# Patient Record
Sex: Female | Born: 1980 | Hispanic: Yes | Marital: Married | Smoking: Never smoker
Health system: Southern US, Community
[De-identification: ages and names within clinical notes are randomized; demographics above are authoritative.]

---
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
## 2004-12-30 ENCOUNTER — Ambulatory Visit: Payer: Self-pay | Admitting: Internal Medicine

## 2005-01-13 ENCOUNTER — Ambulatory Visit: Payer: Self-pay | Admitting: Internal Medicine

## 2010-07-17 NOTE — Discharge Summary (Signed)
[facility]  Patient:    Alexandra Fuentes, Alexandra Fuentes                       MRN: [id number] Adm. Date:  [id number] Disc. Date: [id number] Attending:  Antionette Char Dictator:   Maryelizabeth Rowan, M.D.                           Discharge Summary  DATE OF BIRTH:                1980/12/22  PRIMARY DIAGNOSIS:            Complete spontaneous abortion.  SECONDARY DIAGNOSIS:          Orthostatic hypotension.  HISTORY OF PRESENT ILLNESS:   This 30 year old G1, P0, presented with LMP of February 17, 2000, and estimated gestational age of 15-5/7 weeks.  She presented complaining of heavy vaginal bleeding of onset the morning of admission with cramping and passing of some blood clots.  She has not had a prenatal visit as of yet and does have an appointment at [facility] this week.  Evaluation revealed cervical exam significant for dilation of about 4 cm with bulging membranes, large amount of blood and clots in the vaginal vault.  HOSPITAL COURSE: #1 - The patient was admitted with inevitable spontaneous abortion.  Treated with expectant management.  The patient had an ultrasound with estimated gestational age at 7 weeks. After this finding, the patient passed fetus without difficulty. Dr. Tamela Oddi delivered the fetus and reports minimal bleeding.  The patient was given doxycycline 100 mg IV status post delivery.  The patients blood type was A positive and, therefore, she did not receive RhoGAM.  #2 - ORTHOSTATIC HYPOTENSION:  The patient had significant orthostatics on admission which included a lying blood pressure of 138/58, pulse 48; sitting blood pressure 106/60, pulse 71; standing 102/58, pulse 92.  The patient received three units of IV fluids, and orthostatic hypotension resolved.  The patient still had significant pulse changes going from lying 55, sitting 73, and standing 93.  The patient has been entirely asymptomatic and denied dizziness,  changes in vision, or difficulty ambulating.  Hemoglobin on admission was 14.1 with hematocrit of 40.1.  LABORATORY DATA:              Hemoglobin and hematocrit 14.1 and 40.1.  Ultrasound describing gestational sac in the cervix containing nonliving embryo of approximately 7 weeks 5 days.  Also describing thickened endometrium approximately 2.7 cm.  PROCEDURES:                   Vaginal delivery, as described above, per Dr. Tamela Oddi.  CONSULTATIONS:                None.  DISPOSITION:                  The patient was discharged home.  CONDITION ON DISCHARGE:       Stable.  FOLLOW-UP:                    Follow-up appointment with Dr. Tamela Oddi in one week.  DISCHARGE INSTRUCTIONS:       The patient was instructed to maintain a regular diet with good fluid intake and to call Dr. Tamela Oddi if she has any increase in vaginal bleeding at all. DD:  06/06/00 TD:  06/07/00 Job: [id number]

## 2014-08-19 ENCOUNTER — Ambulatory Visit (INDEPENDENT_AMBULATORY_CARE_PROVIDER_SITE_OTHER): Payer: Self-pay | Admitting: Internal Medicine

## 2014-08-19 VITALS — BP 128/78 | HR 70 | Temp 97.7°F | Resp 17 | Ht 65.0 in | Wt 164.4 lb

## 2014-08-19 DIAGNOSIS — K6289 Other specified diseases of anus and rectum: Secondary | ICD-10-CM

## 2014-08-19 DIAGNOSIS — R37 Sexual dysfunction, unspecified: Secondary | ICD-10-CM

## 2014-08-19 LAB — POCT CBC
Granulocyte percent: 65.2 %G (ref 37–80)
HCT, POC: 42.2 % (ref 37.7–47.9)
Hemoglobin: 14.2 g/dL (ref 12.2–16.2)
Lymph, poc: 2.2 (ref 0.6–3.4)
MCH, POC: 31.1 pg (ref 27–31.2)
MCHC: 33.5 g/dL (ref 31.8–35.4)
MCV: 92.8 fL (ref 80–97)
MID (cbc): 0.7 (ref 0–0.9)
MPV: 8.7 fL (ref 0–99.8)
POC Granulocyte: 5.3 (ref 2–6.9)
POC LYMPH PERCENT: 26.8 %L (ref 10–50)
POC MID %: 8 %M (ref 0–12)
Platelet Count, POC: 227 10*3/uL (ref 142–424)
RBC: 4.55 M/uL (ref 4.04–5.48)
RDW, POC: 13.9 %
WBC: 8.2 10*3/uL (ref 4.6–10.2)

## 2014-08-19 LAB — TSH: TSH: 1.999 u[IU]/mL (ref 0.350–4.500)

## 2014-08-19 MED ORDER — HYDROCORTISONE 1 % RE CREA: 1.0000 "application " | TOPICAL_CREAM | Freq: Two times a day (BID) | RECTAL | Status: AC

## 2014-08-19 NOTE — Progress Notes (Signed)
MRN: [id number] DOB: [birth year]  Subjective:   Alexandra Fuentes is a 34 y.o. female presenting for chief complaint of Hemorrhoids  Hemorrhoids - reports several month history of intermittent itchy, stinging sensation in perianal/rectal area, state that she has palpated a small protrusion. Has not tried medications for relief. Denies fevers, bleeding, bloody stool or blood on the toilet paper, constipation, straining. Patient admits healthy diet and regular exercise. Denies history of hemorrhoids. Of note, patient works out doing heavy lifting including squats and deadlifts, uses heavy weight. smoking cigarettes, has one alcohol drink per week. Denies any other aggravating or relieving factors, no other questions or concerns.  Decreased libido - reports about 1.5 month history of decreased sex drive. Patient cannot identify any inciting factors. States that she has been married for 15 years, is in a monogamous relationship, reports that she has a good healthy relationship with her husband. She does not experience any issues with spousal abuse, recalls that she would enjoy sex, would have orgasms. She denies any mood symptoms including depression or stress. She has one 1 year old daughter and reports that she has a healthy relationship with her as well. She denies any difficulties with her self-image or weight. Denies weight gain, thinning of hair, dry skin. She does not take any medications or have any chronic medical conditions, no past surgical history. Has had copper IUD going on 10 years. Denies history of irregular or painful menstrual cycles, currently has one cycle each month and is regular.  Alexandra Fuentes currently has no medications in their medication list. She has no allergies on file.  Alexandra Fuentes  has no past medical history on file. Also  has no past surgical history on file.  ROS As in subjective.  Objective:   Vitals: BP 128/78 mmHg  Pulse 70  Temp(Src) 97.7 F (36.5 C) (Oral)  Resp  17  Ht  (1.651 m)  Wt 164 lb 6.4 oz (74.571 kg)  BMI 27.36 kg/m2  SpO2 100%  LMP 07/19/2014  Physical Exam  Constitutional: She appears well-developed and well-nourished.  Cardiovascular: Normal rate.   Pulmonary/Chest: Effort normal.  Genitourinary: Rectal exam shows no external hemorrhoid (hemorrhoidal skin tag present ~11 o'clock position), no internal hemorrhoid, no fissure, no mass, no tenderness and anal tone normal.  Skin: Skin is warm and dry. No rash noted. No erythema. No pallor.  Psychiatric: She has a normal mood and affect.   Results for orders placed or performed in visit on 08/19/14 (from the past 24 hour(s))  POCT CBC     Status: None   Collection Time: 08/19/14 12:52 PM  Result Value Ref Range   WBC 8.2 4.6 - 10.2 K/uL   Lymph, poc 2.2 0.6 - 3.4   POC LYMPH PERCENT 26.8 10 - 50 %L   MID (cbc) 0.7 0 - 0.9   POC MID % 8.0 0 - 12 %M   POC Granulocyte 5.3 2 - 6.9   Granulocyte percent 65.2 37 - 80 %G   RBC 4.55 4.04 - 5.48 M/uL   Hemoglobin 14.2 12.2 - 16.2 g/dL   HCT, POC 40.9 81.1 - 47.9 %   MCV 92.8 80 - 97 fL   MCH, POC 31.1 27 - 31.2 pg   MCHC 33.5 31.8 - 35.4 g/dL   RDW, POC 91.4 %   Platelet Count, POC 227 142 - 424 K/uL   MPV 8.7 0 - 99.8 fL   Assessment and Plan :   1. Rectal pain - Suspect this  is do to mild external hemorrhoids which are not apparent at the moment. However, hemorrhoidal skin tag suggests that this may be an intermittent and recurring issue. No evidence of fissures. Recommended using hydrocortisone rectal cream in hemorrhoids return. She may also use Epsom salts for baths. Advised that she return to clinic if this is the case for reexamination. For now I recommended that she take a break from heavy lifting and do more cardio type of exercise. Continue healthy diet.  2. Sexual dysfunction - Labs pending, likely undergoing psychogenic source for decreased libido since she is an otherwise healthy woman. Dr. Merla Riches recommended  reading Pleasure Bond with her husband. Also, made other recommendations regarding her husband's approach with her as well. Follow up in 4-8 weeks.  Wallis Bamberg, PA-C [facility] [phone] 08/19/2014 12:09 PM I have participated in the care of this patient with the Advanced Practice Provider and agree with Diagnosis and Plan as documented. Robert P. Merla Riches, M.D.

## 2014-08-19 NOTE — Patient Instructions (Addendum)
Hydrocortisone rectal cream Qu es este medicamento? La HIDROCORTISONA es un corticosteroide. Se utiliza para disminuir la hinchazn, picazn y dolor provocadas por irritaciones de la piel o hemorroides leves. Este medicamento puede ser utilizado para otros usos; si tiene alguna pregunta consulte con su proveedor de atencin mdica o con su farmacutico. MARCAS COMERCIALES DISPONIBLES: Anusol HC, Procto-Kit, Procto-Pak, Proctocort, Proctocream-HC, Proctosol-HC, Proctozone-HC Tree surgeon a mi profesional de la salud antes de tomar este medicamento? Necesita saber si usted presenta alguno de los siguientes problemas o situaciones: -una reaccin alrgica o inusual a la hidrocortisona, a los corticosteroides, a otros medicamentos, alimentos, colorantes o conservantes -si est embarazada o buscando quedar embarazada -si est amamantando a un beb Cmo debo utilizar este medicamento? Este medicamento es slo para uso rectal. No lo ingiera por va oral. No lo aplique en los ojos. Siga las instrucciones de la etiqueta del [place]. Lvese las manos antes y despus de usarlo. Aplique una pequea capa sobre las zonas afectadas. No lo utilice sobre la piel sana o sobre grandes zonas de la piel. No la cubra con un vendaje o apsito a menos que se lo indique su mdico o su profesional de Technical sales engineer. Si debe cubrir la zona, siga atentamente las instrucciones. Si cubre la zona puede aumentar la cantidad que penetra en la piel, lo cual aumenta el riesgo de tener efectos secundarios. No utilice su medicamento con una frecuencia mayor a la indicada. Es importante que no utilice ms medicamento que lo indicado. Hable con su pediatra para informarse acerca del uso de este medicamento en nios. Puede requerir atencin especial. Si est aplicando este medicamento sobre la zona donde le [employer] paales al nio, no la cubra con paales ajustados o con calzones de plstico. Sobredosis: Pngase en contacto  inmediatamente con un centro toxicolgico o una sala de urgencia si usted cree que haya tomado demasiado medicamento. ATENCIN: [employer] es solo para usted. No comparta este medicamento con nadie. Qu sucede si me olvido de una dosis? Si olvida una dosis, aplquela lo antes posible. Si es casi la hora de la prxima dosis, aplique slo esa dosis. No use dosis dobles o adicionales. Qu puede interactuar con este medicamento? No se esperan interacciones. No utilice otros productos de la piel sobre el rea afectado sin Teacher, adult education a su mdico o a su profesional de Technical sales engineer. Puede ser que esta lista no menciona todas las posibles interacciones. Informe a su profesional de KB Home	[place] de [employer] productos a base de hierbas, medicamentos de [place] o suplementos nutritivos que est tomando. Si usted fuma, consume bebidas alcohlicas o si utiliza drogas ilegales, indqueselo tambin a su profesional de KB Home	[place]. Algunas sustancias pueden interactuar con su medicamento. A qu debo estar atento al usar Coca-Cola? Si sus sntomas no mejoran despus de usar [place], informe a su mdico o a su profesional de KB Home	[place]. Si desarrolla cualquier tipo de infeccin mientras [place] este medicamento, podr Aeronautical engineer de usar este medicamento hasta que su infeccin se mejora. Pregunte a su mdico o su profesional de la salud por asesoramiento. Qu efectos secundarios puedo tener al [employer] este medicamento? Efectos secundarios que debe informar a su mdico o a Barrister's clerk de la salud tan pronto como sea posible: -ardor o picazn de la piel -manchas rojas oscuras en la piel -infeccin -cuando el problema de la piel no se cura -formacin de ampollas llenas de pus, rojas y dolorosas en los folculos pilosos -adelgazamiento de  la piel Efectos secundarios que, por lo general, no requieren atencin mdica (debe informarlos a su mdico o a su profesional de la salud si persisten o si son  molestos): -piel seca, irritacin -crecimiento inusual de vello en el rostro o cuerpo Puede ser que esta lista no menciona todos los posibles efectos secundarios. Comunquese a su mdico por asesoramiento mdico Humana Inc. Usted puede informar los efectos secundarios a la FDA por telfono al 1-800-FDA-1088. Dnde debo guardar mi medicina? Mantngala fuera del alcance de los nios. Gurdela a [employer] Group, entre 20 y 87 grados C (67 y 41 grados F). Protjala del calor y no congelar. Deseche todo el medicamento que no haya utilizado, despus de la fecha de vencimiento. ATENCIN: Este folleto es un resumen. Puede ser que no cubra toda la posible informacin. Si usted tiene preguntas acerca de esta medicina, consulte con su mdico, su farmacutico o su profesional de Technical sales engineer.  2015, Elsevier/Gold Standard. (2006-04-13 12:18:00)   Hemorroides  (Hemorrhoids) Las hemorroides son venas inflamadas alrededor del recto o del ano. Hay dos tipos de hemorroides:   Hemorroides internas. Se forman en las venas del interior del recto. Pueden abultarse hacia el exterior e irritarse y Secretary/administrator.  Hemorroides externas. Se producen en las venas externas al ano y pueden sentirse como un bulto o zona hinchada dura y dolorosa cerca del ano. CAUSAS   Embarazo.   Obesidad.   Constipacin o diarrea.   Dificultad para mover el intestino.   Permanecer sentado durante largos perodos en el inodoro.  Levantar objetos pesados u otras actividades que impliquen esfuerzo.  Sexo anal. SNTOMAS   Dolor.   Picazn o irritacin anal.   Sangrado rectal.   Prdida fecal.   Inflamacin anal.   Uno o ms bultos en la zona anal.  DIAGNSTICO  El mdico puede diagnosticar las hemorroides mediante un examen visual. Otros estudios o anlisis que se pueden realizar son:   Examen de la zona rectal con Ardelia Mems mano enguantada (examen digital rectal).   Examen del canal anal  utilizando un pequeo tubo (endoscopio).   Anlisis de sangre si ha perdido [place] cantidad significativa de [place].  Un estudio para observar el interior del colon (sigmoideoscopa o colonoscopa). TRATAMIENTO  La mayora de las hemorroides pueden tratarse en casa. Sin embargo, si los sntomas no mejoran o tiene Nurse, children's, el mdico puede Optometrist un procedimiento para disminuir las hemorroides o extirparlas completamente. Los tratamientos posibles son:   Colocacin de una banda de goma en la base de la hemorroide para cortar la circulacin (ligadura con Forensic psychologist).   Inyeccin de una sustancia qumica para Neurosurgeon las hemorroides (escleroterapia).   Utilizacin de un instrumento para quemar las hemorroides (terapia con luz infrarroja).   Extirpacin quirrgica de la hemorroides (hemorroidectoma).   Colocacin de grapas en la hemorroides para bloquear el flujo de sangre a los tejidos (engrapado de hemorroides).  INSTRUCCIONES PARA EL CUIDADO EN EL HOGAR   Consuma alimentos con fibra, como cereales integrales, legumbres, frutos secos, frutas y verduras. Pregntele a su mdico acerca de tomar productos con fibra aadida en ellos (suplementos defibra).  Aumente la ingestin de lquidos. Beba gran cantidad de lquido para mantener la orina de tono claro o color amarillo plido.   Haga ejercicios regularmente.   Vaya al bao cuando sienta la necesidad de mover el intestino. No espere.   Evite hacer fuerza al mover el intestino.   Mantenga la zona anal limpia y seca. Use papel higinico hmedo  o toallitas humedecidas despus de mover el intestino.   Puede usar o Midwife segn las indicaciones algunas cremas especiales y supositorios.   Tome slo medicamentos de venta libre o recetados, segn las indicaciones del mdico.   Tome baos de asiento tibios durante 15-20 minutos, 3-4 veces por da para Glass blower/designer y las [place].   Coloque una bolsa de hielo  sobre las hemorroides si le duelen o se hinchan. Usar compresas de [employer]ant baos de asiento puede ser [place].   Ponga el hielo en una bolsa plstica.   Colquese una toalla entre la piel y la bolsa de hielo.   Deje el hielo durante 15 - 20 minutos y aplquelo 3 - 4 veces por Training and development officer.   No utilice una almohada en forma de aro ni se siente en el inodoro durante perodos prolongados. Esto aumenta la afluencia de sangre y Conservation officer, historic buildings.  SOLICITE ATENCIN MDICA SI:   Aumenta el dolor y la hinchazn y no puede controlarlo con la medicacin o con Lexicographer.  Tiene un sangrado que no IT consultant.  No puede mover el intestino.  Siente dolor o tiene inflamacin fuera de la zona de las hemorroides. ASEGRESE DE QUE:   Comprende estas instrucciones.  Controlar su enfermedad.  Recibir ayuda de inmediato si no mejora o si empeora. Document Released: 02/15/2005 Document Revised: 10/18/2012 [facility] Patient Information 2015 [place]. This information is not intended to replace advice given to you by your health care provider. Make sure you discuss any questions you have with your health care provider.   SEXUAL DYSFUNCTION - We recommend reading the book The Pleasure Bond by Wynetta Emery and Masters. - For now, it would be important for you husband to pay attention to any cues that may show up as a source for your decreased sex drive. - It would be important for your husband to also pay attention to certain acts that he can perform to help you with your desire/mood. - Follow up in 4-8 weeks.

## 2017-06-26 ENCOUNTER — Other Ambulatory Visit: Payer: Self-pay

## 2017-06-26 ENCOUNTER — Emergency Department (HOSPITAL_COMMUNITY): Payer: Self-pay

## 2017-06-26 ENCOUNTER — Encounter (HOSPITAL_COMMUNITY): Payer: Self-pay | Admitting: Emergency Medicine

## 2017-06-26 ENCOUNTER — Emergency Department (HOSPITAL_COMMUNITY)
Admission: EM | Admit: 2017-06-26 | Discharge: 2017-06-26 | Disposition: A | Discharge status: Home / Self Care | Payer: Self-pay | Attending: Emergency Medicine | Admitting: Emergency Medicine

## 2017-06-26 DIAGNOSIS — S52612A Displaced fracture of left ulna styloid process, initial encounter for closed fracture: Secondary | ICD-10-CM

## 2017-06-26 DIAGNOSIS — S52615A Nondisplaced fracture of left ulna styloid process, initial encounter for closed fracture: Secondary | ICD-10-CM | POA: Insufficient documentation

## 2017-06-26 DIAGNOSIS — S52501A Unspecified fracture of the lower end of right radius, initial encounter for closed fracture: Secondary | ICD-10-CM

## 2017-06-26 DIAGNOSIS — Y9366 Activity, soccer: Secondary | ICD-10-CM | POA: Insufficient documentation

## 2017-06-26 DIAGNOSIS — Y999 Unspecified external cause status: Secondary | ICD-10-CM | POA: Insufficient documentation

## 2017-06-26 DIAGNOSIS — Y929 Unspecified place or not applicable: Secondary | ICD-10-CM | POA: Insufficient documentation

## 2017-06-26 DIAGNOSIS — W51XXXA Accidental striking against or bumped into by another person, initial encounter: Secondary | ICD-10-CM | POA: Insufficient documentation

## 2017-06-26 MED ORDER — HYDROCODONE-ACETAMINOPHEN 5-325 MG PO TABS: 1.0000 | ORAL_TABLET | Freq: Four times a day (QID) | ORAL | 0 refills | Status: AC | PRN

## 2017-06-26 MED ORDER — HYDROMORPHONE HCL 2 MG/ML IJ SOLN
0.5000 mg | Freq: Once | INTRAMUSCULAR | Status: AC
  Administered 2017-06-26: 0.5 mg via INTRAMUSCULAR
  Filled 2017-06-26: qty 1

## 2017-06-26 NOTE — Progress Notes (Signed)
Orthopedic Tech Progress Note Patient Details:  Alexandra Fuentes [birth year] [id number]  Ortho Devices Type of Ortho Device: Ace wrap, Arm sling, Sugartong splint Ortho Device/Splint Location: lue Ortho Device/Splint Interventions: Application   Post Interventions Patient Tolerated: Well Instructions Provided: Care of device   [name] 06/26/2017, 2:12 PM

## 2017-06-26 NOTE — Discharge Instructions (Signed)
You have a left broken wrist.  Please call and follow up with orthopedist on Tuesday for further care.  Take pain medication as needed, but be aware that it can cause drowsiness.

## 2017-06-26 NOTE — ED Provider Notes (Signed)
[facility] EMERGENCY DEPARTMENT Provider Note   CSN: [id number] Arrival date & time: 06/26/17  1125     History   Chief Complaint Chief Complaint  Patient presents with  . Wrist Injury    HPI Alexandra Fuentes is a 37 y.o. female.  HPI   37 year old female presenting for evaluation of her left wrist injury.  Approximately an hour and a half ago, patient was playing soccer, as she was going for the ball, she collided with another player, fell backward, reaching her left hand to break her fall.  She fell and immediately sharp pain to her left wrist and report a pop.  Pain initially very intense, she nearly passed out, pain is since improved with rest.  Currently rates pain a 6 out of 10.  Denies any pain in her left hand or left elbow.  Denies any numbness.  She is right-hand dominant.  She denies any prior injury to the left hand.  She did take Tylenol prior to arrival.  No past medical history on file.  There are no active problems to display for this patient.   The histories are not reviewed yet. Please review them in the "History" navigator section and refresh this SmartLink.   OB History   None      Home Medications    Prior to Admission medications   Not on File    Family History Family History  Problem Relation Age of Onset  . Cancer Maternal Grandmother     Social History Social History   Tobacco Use  . Smoking status: Never Smoker  Substance Use Topics  . Alcohol use: Not on file  . Drug use: Not on file     Allergies   Patient has no known allergies.   Review of Systems Review of Systems  Constitutional: Negative for fever.  Musculoskeletal: Positive for joint swelling.  Skin: Negative for wound.  Neurological: Negative for numbness.     Physical Exam Updated Vital Signs LMP 06/12/2017   Physical Exam  Constitutional: She appears well-developed and well-nourished. No distress.  HENT:  Head: Atraumatic.  Eyes:  Conjunctivae are normal.  Neck: Neck supple.  Cardiovascular: Intact distal pulses.  Musculoskeletal: She exhibits deformity (Left wrist: Obvious closed deformity noted to left wrist with dorsal angulation, tenderness to palpation with crepitus.  No skin tenting.  Radial pulse 2+.  Decreased range of motion secondary to pain.  Sensation intact distally.).  Left elbow nontender.  Left forearm is soft. L hand with intact sensation, brisk cap refill and no significant pain.   Neurological: She is alert.  Skin: No rash noted.  Psychiatric: She has a normal mood and affect.  Nursing note and vitals reviewed.    ED Treatments / Results  Labs (all labs ordered are listed, but only abnormal results are displayed) Labs Reviewed - No data to display  EKG None  Radiology Dg Wrist Complete Left  Result Date: 06/26/2017 CLINICAL DATA:  Post reduction. EXAM: LEFT WRIST - COMPLETE 3+ VIEW COMPARISON:  Earlier today. FINDINGS: Overlying cast obscures evaluation of bony detail. Interval reduction of patient's distal radial and ulnar fractures with near anatomic alignment on the AP view and very minimal residual dorsal angulation of the distal radial fragment on the lateral view. Remainder the exam is unchanged. IMPRESSION: Near anatomic alignment on the AP view post reduction of patient's distal radial and ulnar fractures with minimal residual dorsal angulation on the lateral view involving the distal radial fragment. Electronically Signed  By: Elberta Fortis M.D.   On: 06/26/2017 14:48   Dg Wrist Complete Left  Result Date: 06/26/2017 CLINICAL DATA:  Pain after trauma EXAM: LEFT WRIST - COMPLETE 3+ VIEW COMPARISON:  None. FINDINGS: There is a fracture through the ulnar styloid. There is a comminuted mildly displaced fracture through the distal radius with angulation based on the lateral view. No other fractures. IMPRESSION: Displaced angulated comminuted fracture through the distal radius. Mildly  displaced ulnar styloid fracture. Electronically Signed   By: Gerome Sam III M.D   On: 06/26/2017 13:01    Procedures .Splint Application Date/Time: 06/26/2017 2:58 PM Performed by: [name] Authorized by: Fayrene Helper, PA-C   Consent:    Consent obtained:  Verbal   Consent given by:  Patient   Risks discussed:  Numbness and swelling   Alternatives discussed:  Observation and no treatment Pre-procedure details:    Sensation:  Normal Procedure details:    Laterality:  Left   Location:  Wrist   Wrist:  L wrist   Strapping: no     Cast type:  Long arm (sugar tong)   Splint type:  Sugar tong   Supplies:  Ortho-Glass Post-procedure details:    Pain:  Improved   Sensation:  Normal   Patient tolerance of procedure:  Tolerated well, no immediate complications   (including critical care time)  Medications Ordered in ED Medications  HYDROmorphone (DILAUDID) injection 0.5 mg (0.5 mg Intramuscular Given 06/26/17 1258)     Initial Impression / Assessment and Plan / ED Course  I have reviewed the triage vital signs and the nursing notes.  Pertinent labs & imaging results that were available during my care of the patient were reviewed by me and considered in my medical decision making (see chart for details).     LMP 06/12/2017    Final Clinical Impressions(s) / ED Diagnoses   Final diagnoses:  Closed fracture of distal end of right radius, unspecified fracture morphology, initial encounter  Closed nondisplaced fracture of styloid process of left ulna, initial encounter    ED Discharge Orders    None     12:15 PM Patient with mechanical injury to her left wrist while playing soccer.  This is nondominant wrist.  There is an obvious closed deformity.  X-ray ordered.  She is neurovascularly intact.  I offered pain medication but patient declined at this time.  2:54 PM X-ray of left wrist demonstrate a displaced angulated comminuted fracture through the distal  radius with mildly displaced ulnar styloid fracture.  This is a closed injury, patient is neurovascularly intact.  Appreciate consultation with hand specialist, Dr. Jena Gauss, who recommend reduction and placement of sugar tong splint.  Patient will need to call or follow-up promptly in the office as she may need surgery.  I was able to successfully reduce her wrist with out using procedural sedation.  Patient tolerates well.  An Ortho-Glass splint was placed with a sling provided.  Pain medication prescribed.  She will follow-up promptly with hand specialist.  Patient is neurovascular intact post reduction.     Fayrene Helper, PA-C 06/26/17 1500    Mancel Bale, MD 06/27/17 [phone]

## 2017-06-26 NOTE — Progress Notes (Signed)
Orthopedic Tech Progress Note Patient Details:  Alexandra Fuentes 01/01/1981 [id number]  Patient ID: Alexandra Fuentes, female   DOB: September 10, 1980, 37 y.o.   MRN: [id number]   Alexandra Fuentes 06/26/2017, 2:35 PM As ordered by PA Laveda Norman; also delete one ortho visit

## 2017-06-26 NOTE — ED Triage Notes (Signed)
Pt was playing a sport when she fell and braced herself with left hand. Pain and deformity to left wrist. Radial pulses 3+,  Brady in 40s-50s. Pt is diaphoretic. States she feels like she might pass out.

## 2019-06-05 IMAGING — DX DG WRIST COMPLETE 3+V*L*
1 series · 2 of 2 positions shown · non-contrast
Comparison: Earlier today.

CLINICAL DATA: Post reduction.

EXAM:
LEFT WRIST - COMPLETE 3+ VIEW

[Series 1: wrist · 0.14mm/px · 2 of 2 slices shown]
[im 1/2]
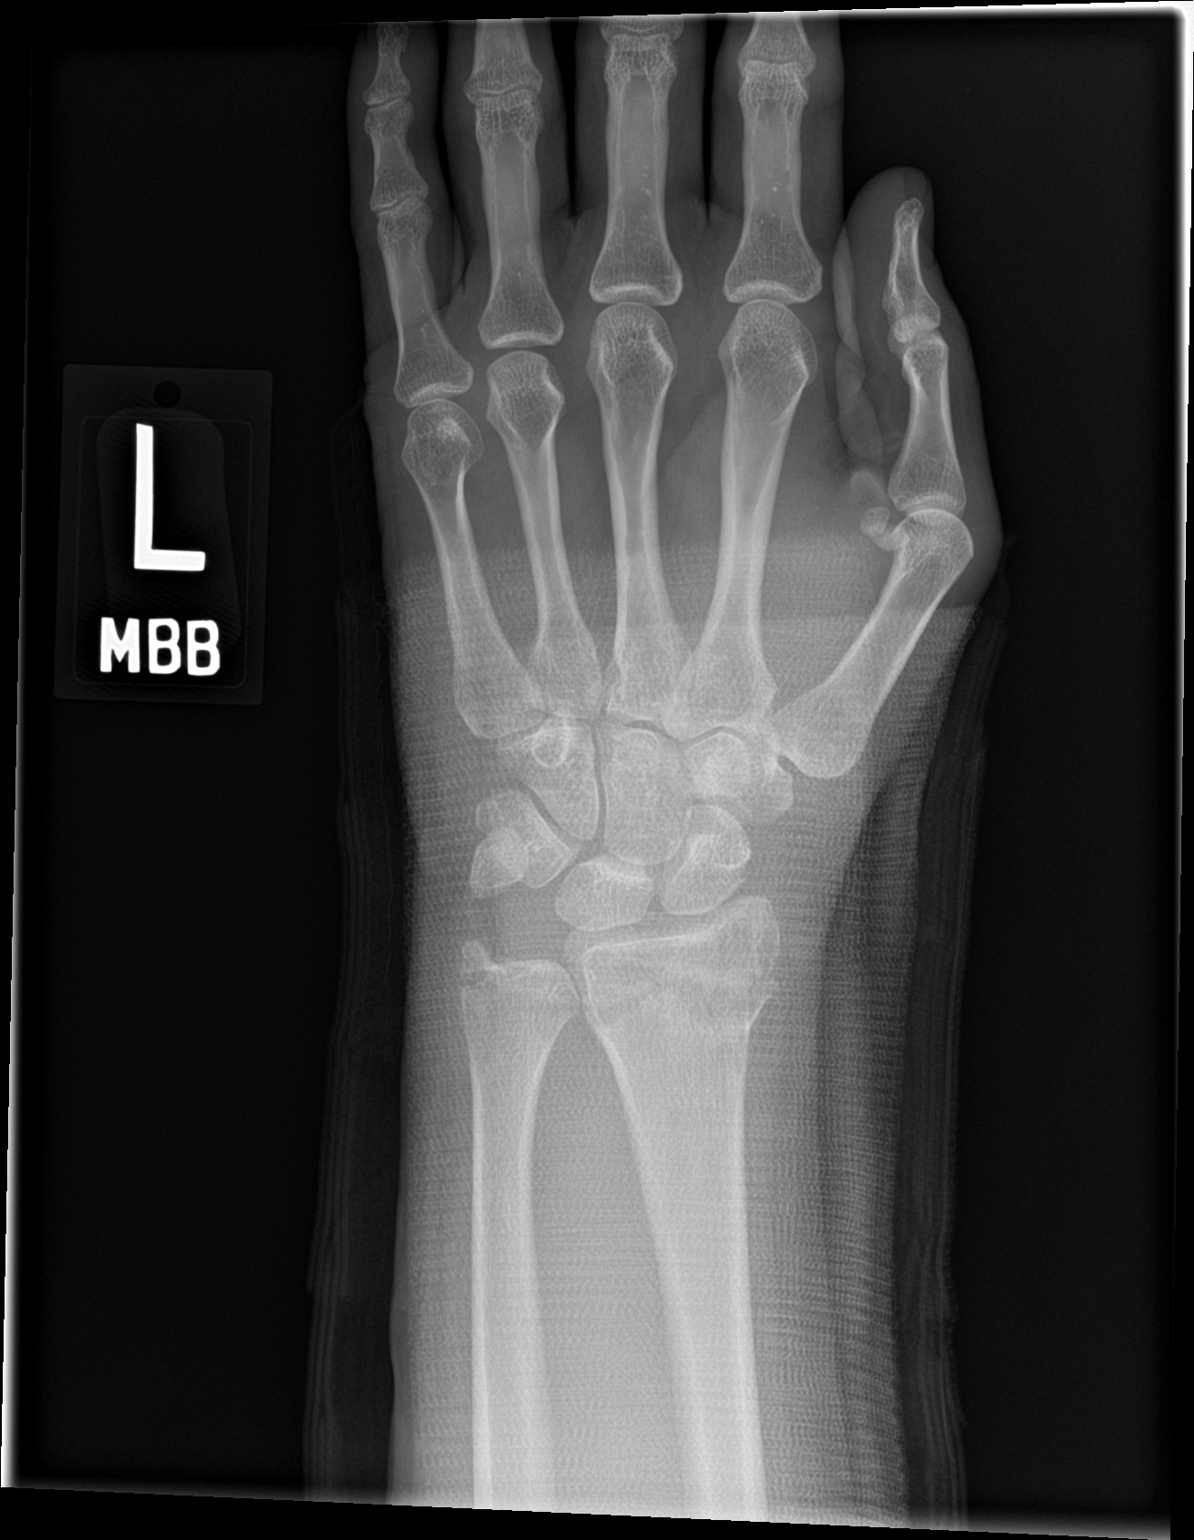
[im 2/2]
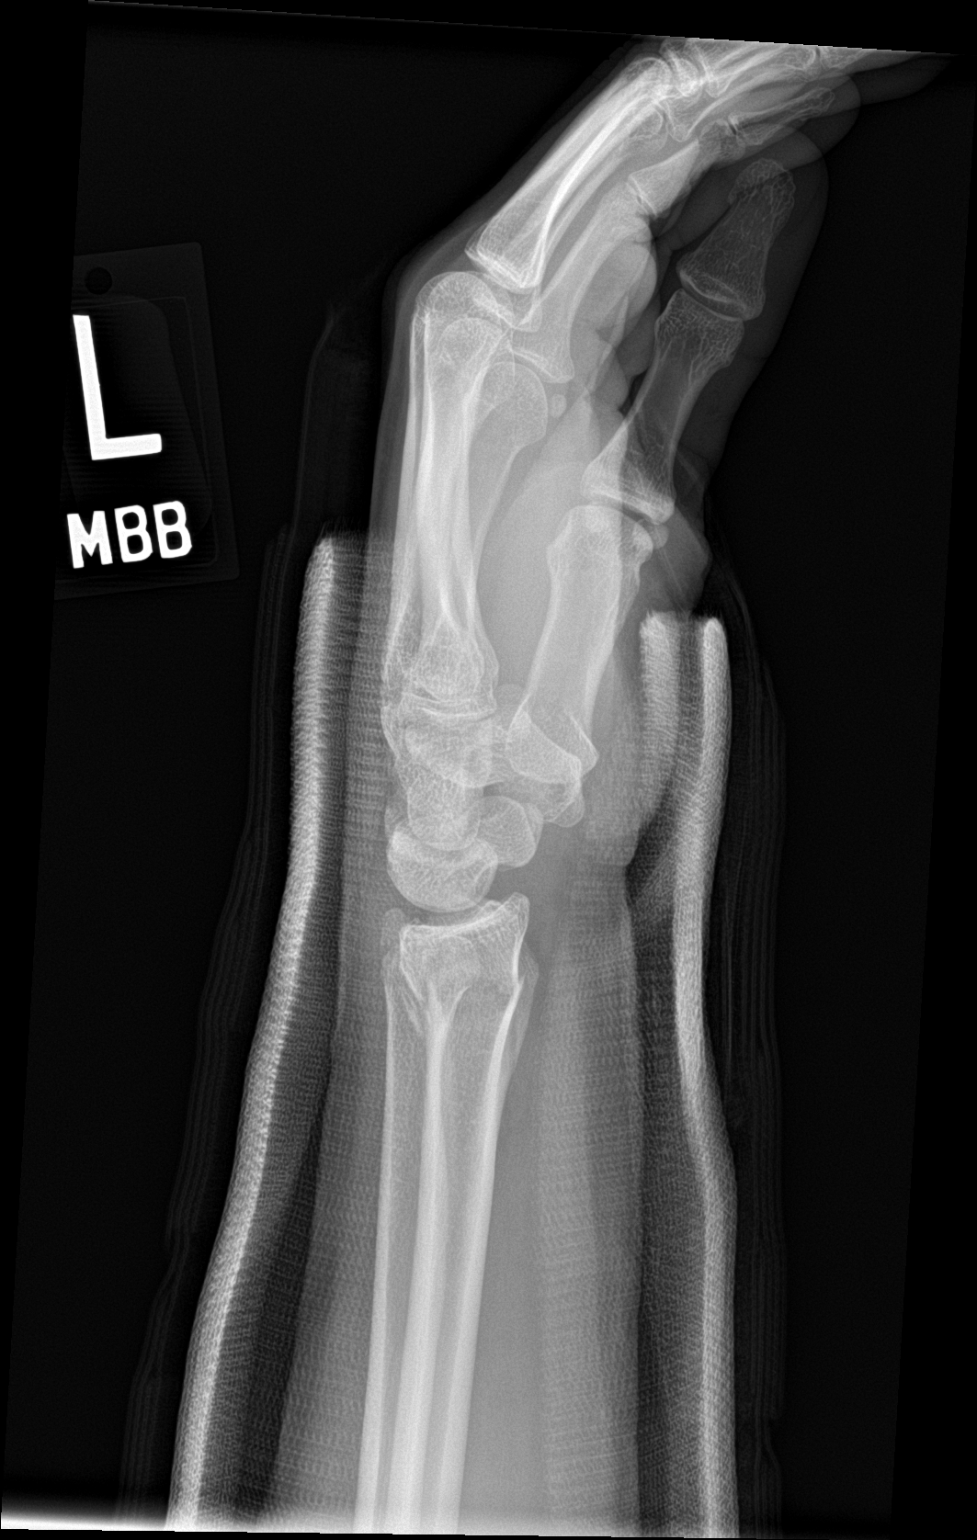

[2 of 2 positions shown; findings below may reference images not displayed]

FINDINGS: Overlying cast obscures evaluation of bony detail. Interval
reduction of patient's distal radial and ulnar fractures with near
anatomic alignment on the AP view and very minimal residual dorsal
angulation of the distal radial fragment on the lateral view.
Remainder the exam is unchanged.
IMPRESSION: Near anatomic alignment on the AP view post reduction of patient's
distal radial and ulnar fractures with minimal residual dorsal
angulation on the lateral view involving the distal radial
fragment..

## 2019-06-05 IMAGING — DX DG WRIST COMPLETE 3+V*L*
1 series · 3 of 3 positions shown · non-contrast
Comparison: None.

CLINICAL DATA: Pain after trauma

EXAM:
LEFT WRIST - COMPLETE 3+ VIEW

[Series 1: wrist · 0.14mm/px · 3 of 3 slices shown]
[im 1/3]
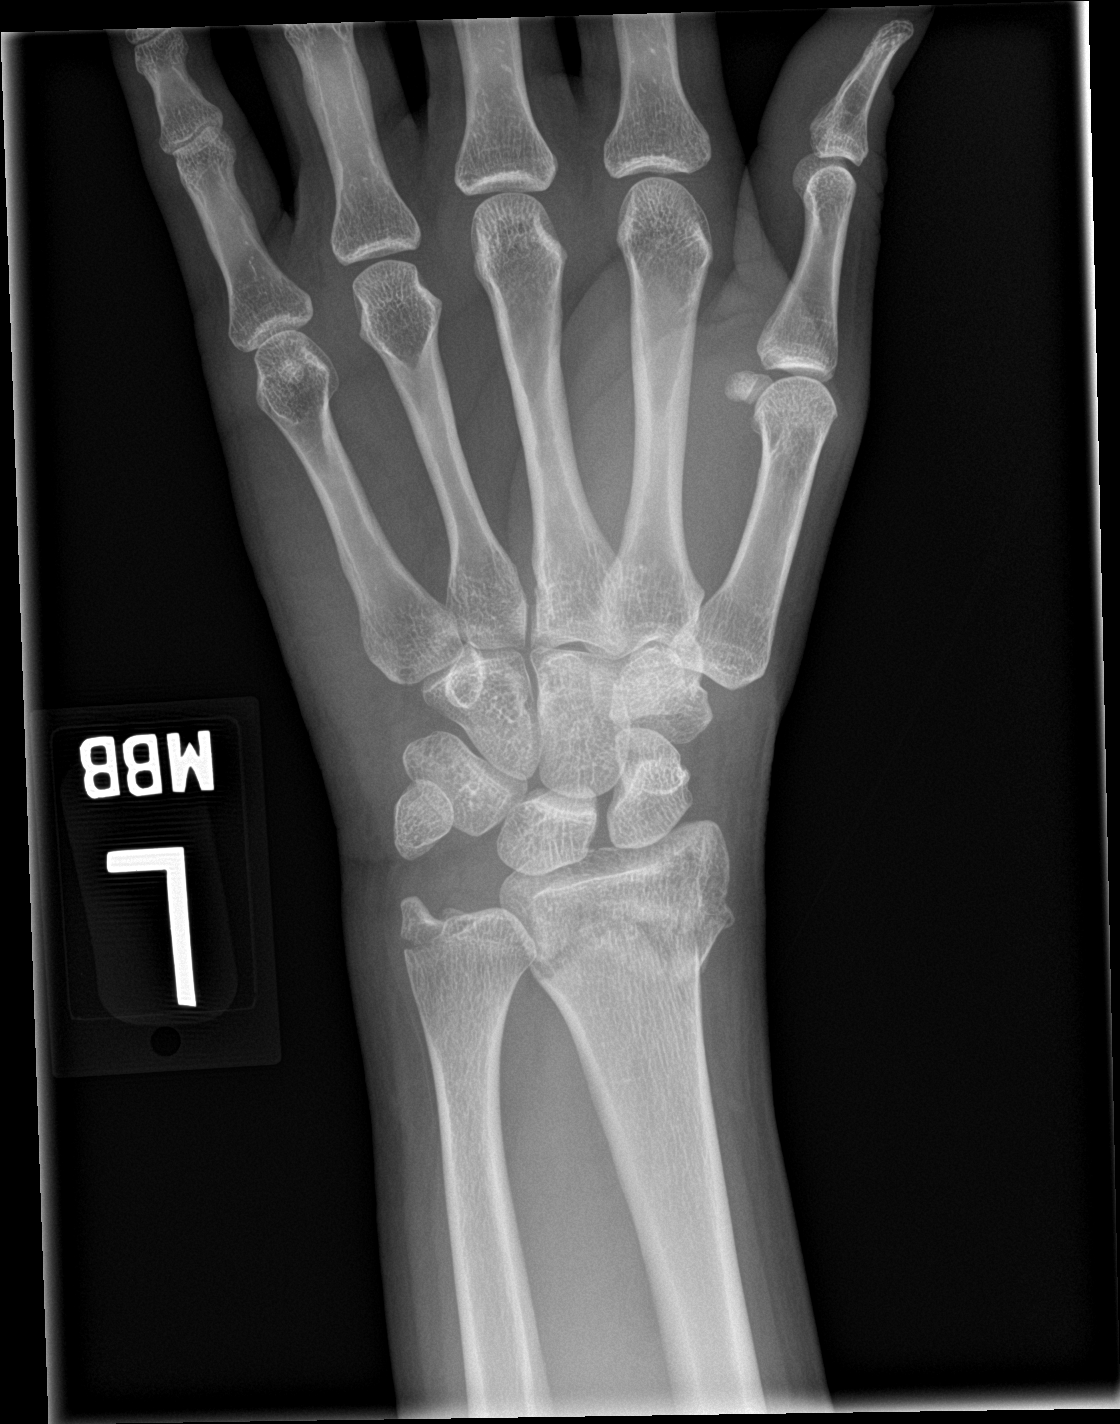
[im 2/3]
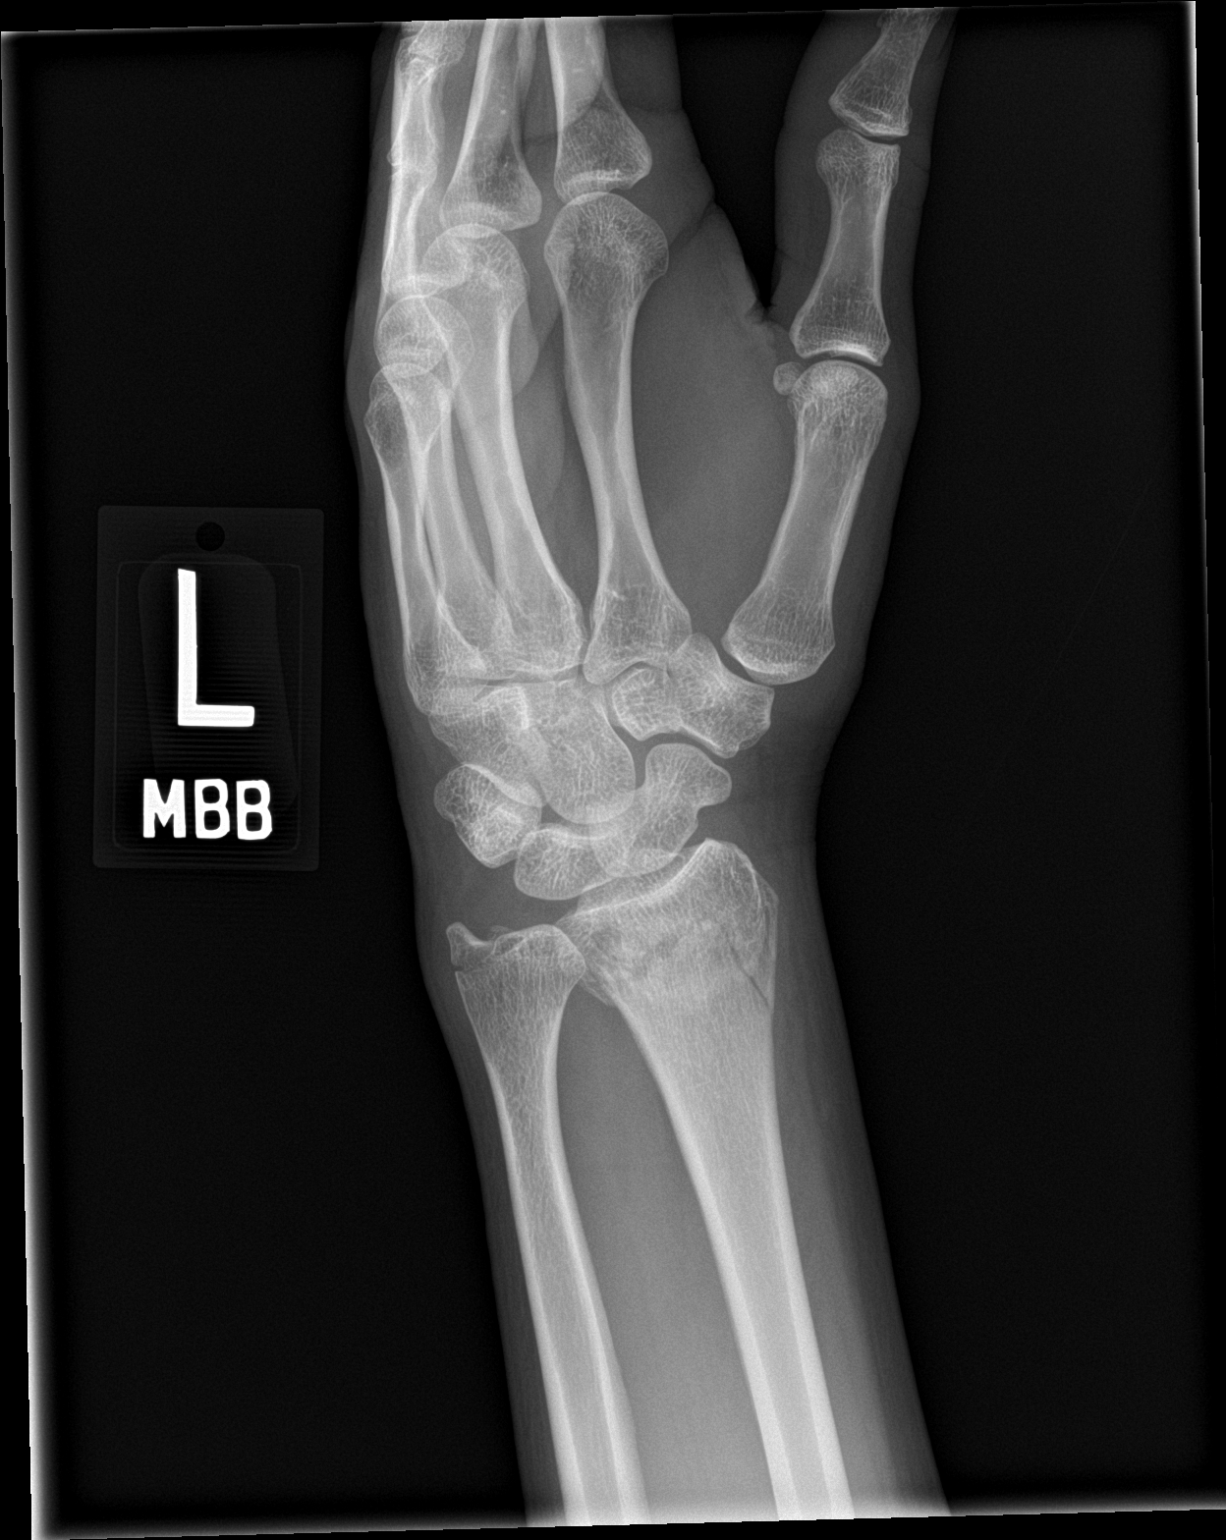
[im 3/3]
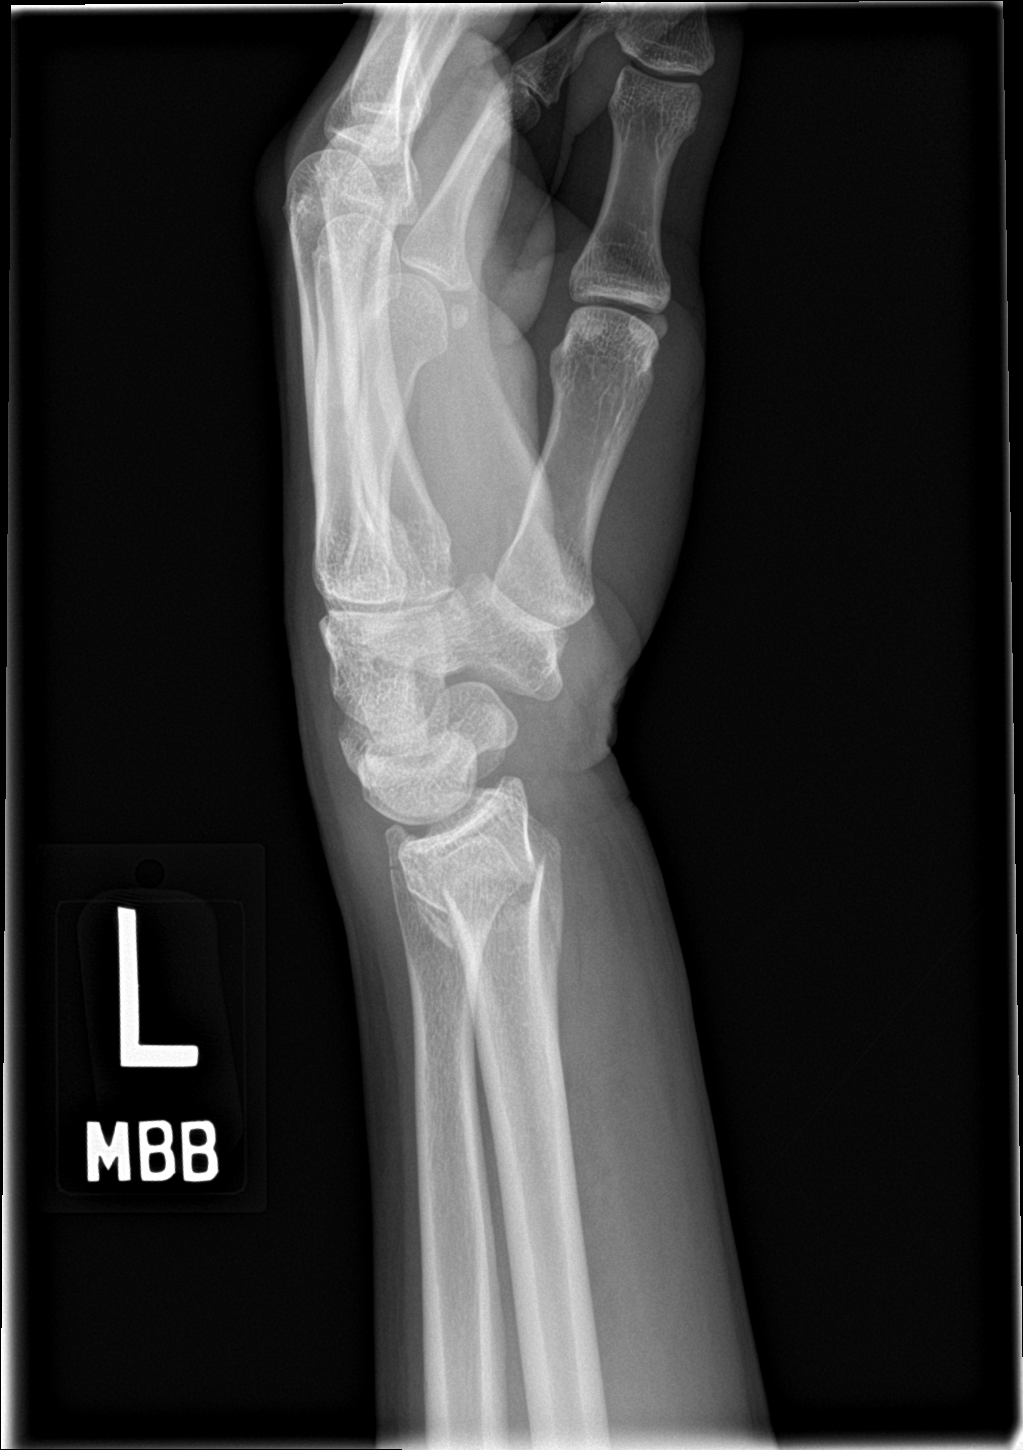

[3 of 3 positions shown; findings below may reference images not displayed]

FINDINGS: There is a fracture through the ulnar styloid. There is a comminuted
mildly displaced fracture through the distal radius with angulation
based on the lateral view. No other fractures.
IMPRESSION: Displaced angulated comminuted fracture through the distal radius.
Mildly displaced ulnar styloid fracture.
